# Patient Record
Sex: Female | Born: 1980
Health system: Southern US, Community
[De-identification: ages and names within clinical notes are randomized; demographics above are authoritative.]

## PROBLEM LIST (undated history)

## (undated) ENCOUNTER — Ambulatory Visit: Payer: BC Managed Care – PPO

## (undated) DIAGNOSIS — K219 Gastro-esophageal reflux disease without esophagitis: Secondary | ICD-10-CM

## (undated) DIAGNOSIS — R000 Tachycardia, unspecified: Secondary | ICD-10-CM

## (undated) HISTORY — PX: COLONOSCOPY: SHX174

## (undated) HISTORY — PX: WISDOM TOOTH EXTRACTION: SHX21

---

## 1998-10-28 ENCOUNTER — Other Ambulatory Visit: Admission: RE | Admit: 1998-10-28 | Discharge: 1998-10-28 | Payer: Self-pay | Admitting: Gynecology

## 1999-10-06 ENCOUNTER — Other Ambulatory Visit: Admission: RE | Admit: 1999-10-06 | Discharge: 1999-10-06 | Payer: Self-pay | Admitting: Gynecology

## 2000-10-25 ENCOUNTER — Other Ambulatory Visit: Admission: RE | Admit: 2000-10-25 | Discharge: 2000-10-25 | Payer: Self-pay | Admitting: Gynecology

## 2001-10-24 ENCOUNTER — Other Ambulatory Visit: Admission: RE | Admit: 2001-10-24 | Discharge: 2001-10-24 | Payer: Self-pay | Admitting: Gynecology

## 2002-10-27 ENCOUNTER — Other Ambulatory Visit: Admission: RE | Admit: 2002-10-27 | Discharge: 2002-10-27 | Payer: Self-pay | Admitting: Gynecology

## 2003-11-19 ENCOUNTER — Other Ambulatory Visit: Admission: RE | Admit: 2003-11-19 | Discharge: 2003-11-19 | Payer: Self-pay | Admitting: Gynecology

## 2004-06-23 ENCOUNTER — Encounter: Admission: RE | Admit: 2004-06-23 | Discharge: 2004-06-23 | Payer: Self-pay | Admitting: Family Medicine

## 2005-12-03 ENCOUNTER — Other Ambulatory Visit: Admission: RE | Admit: 2005-12-03 | Discharge: 2005-12-03 | Payer: Self-pay | Admitting: Gynecology

## 2006-12-13 ENCOUNTER — Other Ambulatory Visit: Admission: RE | Admit: 2006-12-13 | Discharge: 2006-12-13 | Payer: Self-pay | Admitting: Gynecology

## 2014-12-14 ENCOUNTER — Telehealth: Payer: Self-pay | Admitting: Internal Medicine

## 2014-12-14 NOTE — Telephone Encounter (Signed)
Received [facility] records and placed on Dr. Celesta Aver desk for review.

## 2014-12-25 NOTE — Telephone Encounter (Signed)
Both, Dr. Carlean Purl and Dr. Henrene Pastor declined to accept as a new patient.

## 2018-02-21 ENCOUNTER — Other Ambulatory Visit: Payer: Self-pay | Admitting: Obstetrics and Gynecology

## 2018-02-21 DIAGNOSIS — R928 Other abnormal and inconclusive findings on diagnostic imaging of breast: Secondary | ICD-10-CM

## 2018-02-25 ENCOUNTER — Ambulatory Visit
Admission: RE | Admit: 2018-02-25 | Discharge: 2018-02-25 | Disposition: A | Discharge status: Home / Self Care | Payer: BLUE CROSS/BLUE SHIELD | Source: Ambulatory Visit | Attending: Obstetrics and Gynecology | Admitting: Obstetrics and Gynecology

## 2018-02-25 ENCOUNTER — Ambulatory Visit: Payer: Self-pay

## 2018-02-25 ENCOUNTER — Other Ambulatory Visit: Payer: Self-pay | Admitting: Obstetrics and Gynecology

## 2018-02-25 DIAGNOSIS — N6310 Unspecified lump in the right breast, unspecified quadrant: Secondary | ICD-10-CM

## 2018-02-25 DIAGNOSIS — R928 Other abnormal and inconclusive findings on diagnostic imaging of breast: Secondary | ICD-10-CM

## 2018-08-31 ENCOUNTER — Other Ambulatory Visit: Payer: BLUE CROSS/BLUE SHIELD

## 2018-09-06 ENCOUNTER — Ambulatory Visit
Admission: RE | Admit: 2018-09-06 | Discharge: 2018-09-06 | Disposition: A | Discharge status: Home / Self Care | Payer: BC Managed Care – PPO | Source: Ambulatory Visit | Attending: Obstetrics and Gynecology | Admitting: Obstetrics and Gynecology

## 2018-09-06 ENCOUNTER — Other Ambulatory Visit: Payer: Self-pay

## 2018-09-06 ENCOUNTER — Other Ambulatory Visit: Payer: Self-pay | Admitting: Obstetrics and Gynecology

## 2018-09-06 DIAGNOSIS — N6310 Unspecified lump in the right breast, unspecified quadrant: Secondary | ICD-10-CM

## 2019-02-27 ENCOUNTER — Other Ambulatory Visit: Payer: BC Managed Care – PPO

## 2019-02-28 ENCOUNTER — Other Ambulatory Visit: Payer: Self-pay

## 2019-02-28 ENCOUNTER — Ambulatory Visit
Admission: RE | Admit: 2019-02-28 | Discharge: 2019-02-28 | Disposition: A | Discharge status: Home / Self Care | Payer: BC Managed Care – PPO | Source: Ambulatory Visit | Attending: Obstetrics and Gynecology | Admitting: Obstetrics and Gynecology

## 2019-02-28 DIAGNOSIS — N6310 Unspecified lump in the right breast, unspecified quadrant: Secondary | ICD-10-CM

## 2019-06-03 ENCOUNTER — Ambulatory Visit: Payer: BC Managed Care – PPO | Attending: Internal Medicine

## 2019-06-03 DIAGNOSIS — Z23 Encounter for immunization: Secondary | ICD-10-CM | POA: Insufficient documentation

## 2019-06-03 NOTE — Progress Notes (Signed)
   Covid-19 Vaccination Clinic  Name:  Kendra Perez    MRN: [id number] DOB: [birth year]  06/03/2019  Ms. Que was observed post Covid-19 immunization for 15 minutes without incidence. She was provided with Vaccine Information Sheet and instruction to access the V-Safe system.   [name] was instructed to call 911 with any severe reactions post vaccine: Marland Kitchen Difficulty breathing  . Swelling of your face and throat  . A fast heartbeat  . A bad rash all over your body  . Dizziness and weakness    Immunizations Administered    Name Date Dose VIS Date Route   Pfizer COVID-19 Vaccine 06/03/2019  9:41 AM 0.3 mL 03/17/2019 Intramuscular   Manufacturer: [place]   Lot: [id number]   [place]: [id number]

## 2019-06-04 ENCOUNTER — Ambulatory Visit: Payer: BC Managed Care – PPO

## 2019-06-24 ENCOUNTER — Ambulatory Visit: Payer: BC Managed Care – PPO | Attending: Internal Medicine

## 2019-06-24 DIAGNOSIS — Z23 Encounter for immunization: Secondary | ICD-10-CM

## 2019-06-24 NOTE — Progress Notes (Signed)
   Covid-19 Vaccination Clinic  Name:  Sharletta Bolan    MRN: [id number] DOB: [birth year]  06/24/2019  Ms. Rarick was observed post Covid-19 immunization for 15 minutes without incident. She was provided with Vaccine Information Sheet and instruction to access the V-Safe system.   Ms. Feehan was instructed to call 911 with any severe reactions post vaccine: Marland Kitchen Difficulty breathing  . Swelling of face and throat  . A fast heartbeat  . A bad rash all over body  . Dizziness and weakness   Immunizations Administered    Name Date Dose VIS Date Route   Pfizer COVID-19 Vaccine 06/24/2019  1:07 PM 0.3 mL 03/17/2019 Intramuscular   Manufacturer: [place]   Lot: [id number]   [place]: [id number]

## 2019-06-28 ENCOUNTER — Ambulatory Visit: Payer: BC Managed Care – PPO

## 2020-01-12 ENCOUNTER — Other Ambulatory Visit: Payer: Self-pay | Admitting: Obstetrics and Gynecology

## 2020-01-12 DIAGNOSIS — N6310 Unspecified lump in the right breast, unspecified quadrant: Secondary | ICD-10-CM

## 2020-01-12 DIAGNOSIS — Z1231 Encounter for screening mammogram for malignant neoplasm of breast: Secondary | ICD-10-CM

## 2020-03-04 ENCOUNTER — Ambulatory Visit: Payer: BC Managed Care – PPO

## 2020-03-07 ENCOUNTER — Ambulatory Visit
Admission: RE | Admit: 2020-03-07 | Discharge: 2020-03-07 | Disposition: A | Discharge status: Home / Self Care | Payer: BC Managed Care – PPO | Source: Ambulatory Visit | Attending: Obstetrics and Gynecology | Admitting: Obstetrics and Gynecology

## 2020-03-07 ENCOUNTER — Other Ambulatory Visit: Payer: Self-pay

## 2020-03-07 DIAGNOSIS — N6310 Unspecified lump in the right breast, unspecified quadrant: Secondary | ICD-10-CM

## 2020-07-17 NOTE — H&P (Signed)
Kendra Perez is an 40 y.o. G51 female here for scheduled abdominal myomectomy. Pt was diagnosed with a fibroid after had bouts of pelivc pain with radiation into her back. [place] confirmed pelvic exam findings as follows:  05/14/20 [place]: 12.5x11.7x7cm , multiple fibroids ( 8cm, 4cm and 3.7cm) : largest is left lateral, subserosal and pedunculated.   Pain is not persistent. Has pelvic pressure.  Menses regular. Hx atrophic vaginitis. Not certain she desires future fertility  Pertinent Gynecological History: Menses: regular  Bleeding: n/a Contraception: none DES exposure: denies Blood transfusions: none Sexually transmitted diseases: no past history Previous GYN Procedures: none  Last mammogram: normal Date: 03/2020 Last pap: normal Date: 03/2020 OB History: G0, P0   Menstrual History: Menarche age: n/a No LMP recorded.    No past medical history on file.  No past surgical history on file.  No family history on file.  Social History:  has no history on file for tobacco use, alcohol use, and drug use.  Allergies: Not on File  No medications prior to admission.    Review of Systems  Constitutional: Negative for activity change and fatigue.  Eyes: Negative for visual disturbance.  Respiratory: Negative for chest tightness and shortness of breath.   Cardiovascular: Negative for chest pain, palpitations and leg swelling.  Gastrointestinal: Negative for abdominal pain.  Genitourinary: Positive for pelvic pain.  Musculoskeletal: Negative for back pain and myalgias.  Neurological: Negative for headaches.  Psychiatric/Behavioral: Negative for sleep disturbance. The patient is nervous/anxious.     There were no vitals taken for this visit. Physical Exam Vitals and nursing note reviewed. Exam conducted with a chaperone present.  Constitutional:      Appearance: Normal appearance. She is normal weight.  Cardiovascular:     Rate and Rhythm: Normal rate.     Pulses: Normal  pulses.  Pulmonary:     Effort: Pulmonary effort is normal.  Abdominal:     Palpations: Abdomen is soft.  Genitourinary:    General: Normal vulva.  Musculoskeletal:        General: Normal range of motion.  Skin:    General: Skin is warm and dry.     Capillary Refill: Capillary refill takes 2 to 3 seconds.  Neurological:     Mental Status: She is alert and oriented to person, place, and time.  Psychiatric:        Mood and Affect: Mood normal.        Behavior: Behavior normal.        Thought Content: Thought content normal.        Judgment: Judgment normal.     No results found for this or any previous visit (from the past 24 hour(s)).  No results found.  Assessment/Plan: 40yo G0 female here for scheduled abdominal myomectomy  - Admit - Covid screen - TXA IV preop and misoprostol pv for bleeding mgmt - IV tylenol preop for better pain mgmt - Review procedure and verify consent  - To OR when ready  Venetia Night Devonte Migues 07/17/2020, 11:10 PM

## 2020-07-26 ENCOUNTER — Other Ambulatory Visit (HOSPITAL_COMMUNITY)
Admission: RE | Admit: 2020-07-26 | Discharge: 2020-07-26 | Disposition: A | Discharge status: Home / Self Care | Payer: BC Managed Care – PPO | Source: Ambulatory Visit | Attending: Obstetrics and Gynecology | Admitting: Obstetrics and Gynecology

## 2020-07-26 DIAGNOSIS — Z01812 Encounter for preprocedural laboratory examination: Secondary | ICD-10-CM | POA: Insufficient documentation

## 2020-07-26 DIAGNOSIS — Z20822 Contact with and (suspected) exposure to covid-19: Secondary | ICD-10-CM | POA: Insufficient documentation

## 2020-07-26 LAB — SARS CORONAVIRUS 2 (TAT 6-24 HRS): SARS Coronavirus 2: NEGATIVE

## 2020-07-29 ENCOUNTER — Encounter (HOSPITAL_COMMUNITY): Payer: Self-pay | Admitting: Obstetrics and Gynecology

## 2020-07-29 NOTE — Progress Notes (Signed)
Spoke with pt for pre-op call. Pt states she has a hx of tachycardia, takes Metoprolol. Dr. Mayra Neer is her PCP and follows this.   Covid test done 07/26/20 and it's negative. Pt states she's been in quarantine since the test was done and understands that she stays in quarantine until she comes to the hospital tomorrow.

## 2020-07-30 ENCOUNTER — Encounter (HOSPITAL_COMMUNITY)
Admission: RE | Disposition: A | Discharge status: Home / Self Care | Payer: Self-pay | Source: Home / Self Care | Attending: Obstetrics and Gynecology

## 2020-07-30 ENCOUNTER — Other Ambulatory Visit: Payer: Self-pay

## 2020-07-30 ENCOUNTER — Inpatient Hospital Stay (HOSPITAL_COMMUNITY)
Admission: RE | Admit: 2020-07-30 | Discharge: 2020-07-31 | DRG: 743 | Disposition: A | Discharge status: Home / Self Care | Payer: BC Managed Care – PPO | Attending: Obstetrics and Gynecology | Admitting: Obstetrics and Gynecology

## 2020-07-30 ENCOUNTER — Inpatient Hospital Stay (HOSPITAL_COMMUNITY): Payer: BC Managed Care – PPO | Admitting: Anesthesiology

## 2020-07-30 DIAGNOSIS — K219 Gastro-esophageal reflux disease without esophagitis: Secondary | ICD-10-CM | POA: Diagnosis present

## 2020-07-30 DIAGNOSIS — K660 Peritoneal adhesions (postprocedural) (postinfection): Secondary | ICD-10-CM | POA: Diagnosis present

## 2020-07-30 DIAGNOSIS — D252 Subserosal leiomyoma of uterus: Secondary | ICD-10-CM | POA: Diagnosis present

## 2020-07-30 DIAGNOSIS — D259 Leiomyoma of uterus, unspecified: Secondary | ICD-10-CM | POA: Diagnosis present

## 2020-07-30 HISTORY — DX: Tachycardia, unspecified: R00.0

## 2020-07-30 HISTORY — PX: MYOMECTOMY: SHX85

## 2020-07-30 HISTORY — DX: Gastro-esophageal reflux disease without esophagitis: K21.9

## 2020-07-30 LAB — CBC
HCT: 45.5 % (ref 36.0–46.0)
Hemoglobin: 15.2 g/dL — ABNORMAL HIGH (ref 12.0–15.0)
MCH: 30.3 pg (ref 26.0–34.0)
MCHC: 33.4 g/dL (ref 30.0–36.0)
MCV: 90.8 fL (ref 80.0–100.0)
Platelets: 184 10*3/uL (ref 150–400)
RBC: 5.01 MIL/uL (ref 3.87–5.11)
RDW: 12.4 % (ref 11.5–15.5)
WBC: 9.8 10*3/uL (ref 4.0–10.5)
nRBC: 0 % (ref 0.0–0.2)

## 2020-07-30 LAB — ABO/RH: ABO/RH(D): O POS

## 2020-07-30 LAB — TYPE AND SCREEN
ABO/RH(D): O POS
Antibody Screen: NEGATIVE

## 2020-07-30 LAB — POCT PREGNANCY, URINE: Preg Test, Ur: NEGATIVE

## 2020-07-30 SURGERY — MYOMECTOMY, ABDOMINAL APPROACH
Anesthesia: General | Site: Abdomen

## 2020-07-30 MED ORDER — LACTATED RINGERS IV SOLN: INTRAVENOUS | Status: DC

## 2020-07-30 MED ORDER — POVIDONE-IODINE 10 % EX SWAB
2.0000 "application " | Freq: Once | CUTANEOUS | Status: AC
  Administered 2020-07-30: 2 via TOPICAL

## 2020-07-30 MED ORDER — FENTANYL CITRATE (PF) 250 MCG/5ML IJ SOLN
INTRAMUSCULAR | Status: AC
  Filled 2020-07-30: qty 5

## 2020-07-30 MED ORDER — BUPIVACAINE HCL (PF) 0.25 % IJ SOLN
INTRAMUSCULAR | Status: AC
  Filled 2020-07-30: qty 30

## 2020-07-30 MED ORDER — FENTANYL CITRATE (PF) 250 MCG/5ML IJ SOLN
INTRAMUSCULAR | Status: DC | PRN
  Administered 2020-07-30: 100 ug via INTRAVENOUS
  Administered 2020-07-30 (×3): 50 ug via INTRAVENOUS

## 2020-07-30 MED ORDER — ORAL CARE MOUTH RINSE: 15.0000 mL | Freq: Once | OROMUCOSAL | Status: DC

## 2020-07-30 MED ORDER — MIDAZOLAM HCL 5 MG/5ML IJ SOLN
INTRAMUSCULAR | Status: DC | PRN
  Administered 2020-07-30 (×2): 1 mg via INTRAVENOUS

## 2020-07-30 MED ORDER — CHLORHEXIDINE GLUCONATE 0.12 % MT SOLN: 15.0000 mL | Freq: Once | OROMUCOSAL | Status: AC

## 2020-07-30 MED ORDER — SODIUM CHLORIDE (PF) 0.9 % IJ SOLN
INTRAMUSCULAR | Status: AC
  Filled 2020-07-30: qty 250

## 2020-07-30 MED ORDER — TRANEXAMIC ACID-NACL 1000-0.7 MG/100ML-% IV SOLN: 1000.0000 mg | INTRAVENOUS | Status: DC

## 2020-07-30 MED ORDER — CHLORHEXIDINE GLUCONATE 0.12 % MT SOLN
OROMUCOSAL | Status: AC
  Administered 2020-07-30: 15 mL via OROMUCOSAL
  Filled 2020-07-30: qty 15

## 2020-07-30 MED ORDER — ONDANSETRON HCL 4 MG PO TABS: 4.0000 mg | ORAL_TABLET | Freq: Four times a day (QID) | ORAL | Status: DC | PRN

## 2020-07-30 MED ORDER — LIDOCAINE 2% (20 MG/ML) 5 ML SYRINGE
INTRAMUSCULAR | Status: DC | PRN
  Administered 2020-07-30: 60 mg via INTRAVENOUS

## 2020-07-30 MED ORDER — LACTATED RINGERS IV SOLN: INTRAVENOUS | Status: DC | PRN

## 2020-07-30 MED ORDER — SODIUM CHLORIDE (PF) 0.9 % IJ SOLN
INTRAMUSCULAR | Status: DC | PRN
  Administered 2020-07-30: 200 mL

## 2020-07-30 MED ORDER — OXYCODONE HCL 5 MG PO TABS: 5.0000 mg | ORAL_TABLET | Freq: Once | ORAL | Status: DC | PRN

## 2020-07-30 MED ORDER — 0.9 % SODIUM CHLORIDE (POUR BTL) OPTIME
TOPICAL | Status: DC | PRN
  Administered 2020-07-30: 1000 mL

## 2020-07-30 MED ORDER — PROPOFOL 10 MG/ML IV BOLUS
INTRAVENOUS | Status: DC | PRN
  Administered 2020-07-30: 200 mg via INTRAVENOUS

## 2020-07-30 MED ORDER — SENNA 8.6 MG PO TABS
1.0000 | ORAL_TABLET | Freq: Two times a day (BID) | ORAL | Status: DC
  Administered 2020-07-30 – 2020-07-31 (×3): 8.6 mg via ORAL
  Filled 2020-07-30 (×3): qty 1

## 2020-07-30 MED ORDER — PROMETHAZINE HCL 25 MG/ML IJ SOLN: 6.2500 mg | INTRAMUSCULAR | Status: DC | PRN

## 2020-07-30 MED ORDER — SENNOSIDES-DOCUSATE SODIUM 8.6-50 MG PO TABS: 1.0000 | ORAL_TABLET | Freq: Every evening | ORAL | Status: DC | PRN

## 2020-07-30 MED ORDER — GENTAMICIN SULFATE 40 MG/ML IJ SOLN
INTRAVENOUS | Status: DC | PRN
  Administered 2020-07-30: 100.64999999999999 mg via INTRAVENOUS

## 2020-07-30 MED ORDER — ORAL CARE MOUTH RINSE
15.0000 mL | Freq: Once | OROMUCOSAL | Status: AC
Start: 2020-07-30 — End: 2020-07-30

## 2020-07-30 MED ORDER — PHENYLEPHRINE HCL-NACL 10-0.9 MG/250ML-% IV SOLN
INTRAVENOUS | Status: DC | PRN
  Administered 2020-07-30: 25 ug/min via INTRAVENOUS

## 2020-07-30 MED ORDER — IBUPROFEN 600 MG PO TABS
600.0000 mg | ORAL_TABLET | Freq: Four times a day (QID) | ORAL | Status: DC
  Administered 2020-07-30 – 2020-07-31 (×5): 600 mg via ORAL
  Filled 2020-07-30 (×5): qty 1

## 2020-07-30 MED ORDER — PROPOFOL 10 MG/ML IV BOLUS
INTRAVENOUS | Status: AC
  Filled 2020-07-30: qty 40

## 2020-07-30 MED ORDER — ALBUMIN HUMAN 5 % IV SOLN: INTRAVENOUS | Status: DC | PRN

## 2020-07-30 MED ORDER — METHYLENE BLUE 0.5 % INJ SOLN
INTRAVENOUS | Status: AC
  Filled 2020-07-30: qty 10

## 2020-07-30 MED ORDER — ONDANSETRON HCL 4 MG/2ML IJ SOLN: 4.0000 mg | Freq: Four times a day (QID) | INTRAMUSCULAR | Status: DC | PRN

## 2020-07-30 MED ORDER — FENTANYL CITRATE (PF) 100 MCG/2ML IJ SOLN
INTRAMUSCULAR | Status: AC
  Filled 2020-07-30: qty 2

## 2020-07-30 MED ORDER — OXYCODONE HCL 5 MG/5ML PO SOLN: 5.0000 mg | Freq: Once | ORAL | Status: DC | PRN

## 2020-07-30 MED ORDER — ROCURONIUM BROMIDE 10 MG/ML (PF) SYRINGE
PREFILLED_SYRINGE | INTRAVENOUS | Status: DC | PRN
  Administered 2020-07-30: 20 mg via INTRAVENOUS
  Administered 2020-07-30: 10 mg via INTRAVENOUS
  Administered 2020-07-30: 60 mg via INTRAVENOUS

## 2020-07-30 MED ORDER — MIDAZOLAM HCL 2 MG/2ML IJ SOLN
INTRAMUSCULAR | Status: AC
  Filled 2020-07-30: qty 2

## 2020-07-30 MED ORDER — ONDANSETRON HCL 4 MG/2ML IJ SOLN
INTRAMUSCULAR | Status: DC | PRN
  Administered 2020-07-30: 4 mg via INTRAVENOUS

## 2020-07-30 MED ORDER — VASOPRESSIN 20 UNIT/ML IV SOLN
INTRAVENOUS | Status: DC | PRN
  Administered 2020-07-30: 20 mL via INTRAMUSCULAR

## 2020-07-30 MED ORDER — CLINDAMYCIN PHOSPHATE 900 MG/50ML IV SOLN
INTRAVENOUS | Status: DC | PRN
  Administered 2020-07-30: 900 mg via INTRAVENOUS

## 2020-07-30 MED ORDER — KETOROLAC TROMETHAMINE 30 MG/ML IJ SOLN
INTRAMUSCULAR | Status: AC
  Filled 2020-07-30: qty 1

## 2020-07-30 MED ORDER — KETOROLAC TROMETHAMINE 30 MG/ML IJ SOLN
30.0000 mg | Freq: Once | INTRAMUSCULAR | Status: AC
  Administered 2020-07-30: 30 mg via INTRAVENOUS

## 2020-07-30 MED ORDER — OXYCODONE HCL 5 MG PO TABS
5.0000 mg | ORAL_TABLET | ORAL | Status: DC | PRN
  Administered 2020-07-30 (×2): 5 mg via ORAL
  Filled 2020-07-30 (×2): qty 1

## 2020-07-30 MED ORDER — GENTAMICIN SULFATE 40 MG/ML IJ SOLN
1.5000 mg/kg | INTRAVENOUS | Status: DC
  Filled 2020-07-30: qty 2.25

## 2020-07-30 MED ORDER — ACETAMINOPHEN 500 MG PO TABS
1000.0000 mg | ORAL_TABLET | ORAL | Status: AC
  Administered 2020-07-30: 1000 mg via ORAL
  Filled 2020-07-30: qty 2

## 2020-07-30 MED ORDER — SIMETHICONE 80 MG PO CHEW: 80.0000 mg | CHEWABLE_TABLET | Freq: Four times a day (QID) | ORAL | Status: DC | PRN

## 2020-07-30 MED ORDER — ACETAMINOPHEN 500 MG PO TABS
1000.0000 mg | ORAL_TABLET | Freq: Four times a day (QID) | ORAL | Status: DC
  Administered 2020-07-30 – 2020-07-31 (×4): 1000 mg via ORAL
  Filled 2020-07-30 (×5): qty 2

## 2020-07-30 MED ORDER — METHYLENE BLUE 0.5 % INJ SOLN
INTRAVENOUS | Status: DC | PRN
  Administered 2020-07-30: 10 mL

## 2020-07-30 MED ORDER — BUPIVACAINE HCL (PF) 0.25 % IJ SOLN
INTRAMUSCULAR | Status: DC | PRN
  Administered 2020-07-30: 24 mL

## 2020-07-30 MED ORDER — SUGAMMADEX SODIUM 200 MG/2ML IV SOLN
INTRAVENOUS | Status: DC | PRN
  Administered 2020-07-30: 140 mg via INTRAVENOUS

## 2020-07-30 MED ORDER — VASOPRESSIN 20 UNIT/ML IV SOLN
INTRAVENOUS | Status: AC
  Filled 2020-07-30: qty 1

## 2020-07-30 MED ORDER — CHLORHEXIDINE GLUCONATE 0.12 % MT SOLN: 15.0000 mL | Freq: Once | OROMUCOSAL | Status: DC

## 2020-07-30 MED ORDER — METOPROLOL SUCCINATE ER 25 MG PO TB24
25.0000 mg | ORAL_TABLET | Freq: Two times a day (BID) | ORAL | Status: DC
  Administered 2020-07-30 – 2020-07-31 (×2): 25 mg via ORAL
  Filled 2020-07-30 (×2): qty 1

## 2020-07-30 MED ORDER — FENTANYL CITRATE (PF) 100 MCG/2ML IJ SOLN
25.0000 ug | INTRAMUSCULAR | Status: DC | PRN
  Administered 2020-07-30 (×2): 25 ug via INTRAVENOUS

## 2020-07-30 MED ORDER — CLINDAMYCIN PHOSPHATE 900 MG/50ML IV SOLN
900.0000 mg | INTRAVENOUS | Status: DC
  Filled 2020-07-30: qty 50

## 2020-07-30 MED ORDER — SCOPOLAMINE 1 MG/3DAYS TD PT72
MEDICATED_PATCH | TRANSDERMAL | Status: DC | PRN
  Administered 2020-07-30: 1 via TRANSDERMAL

## 2020-07-30 MED ORDER — DEXAMETHASONE SODIUM PHOSPHATE 10 MG/ML IJ SOLN
INTRAMUSCULAR | Status: DC | PRN
  Administered 2020-07-30: 5 mg via INTRAVENOUS

## 2020-07-30 MED ORDER — BUPIVACAINE-EPINEPHRINE (PF) 0.25% -1:200000 IJ SOLN
INTRAMUSCULAR | Status: DC | PRN
  Administered 2020-07-30 (×2): 30 mL

## 2020-07-30 MED ORDER — TRANEXAMIC ACID-NACL 1000-0.7 MG/100ML-% IV SOLN
1000.0000 mg | INTRAVENOUS | Status: AC
  Administered 2020-07-30: 1000 mg via INTRAVENOUS
  Filled 2020-07-30: qty 100

## 2020-07-30 MED ORDER — PHENYLEPHRINE HCL (PRESSORS) 10 MG/ML IV SOLN
INTRAVENOUS | Status: DC | PRN
  Administered 2020-07-30: 120 ug via INTRAVENOUS

## 2020-07-30 SURGICAL SUPPLY — 50 items
APL SKNCLS STERI-STRIP NONHPOA (GAUZE/BANDAGES/DRESSINGS) ×1
BARRIER ADHS 3X4 INTERCEED (GAUZE/BANDAGES/DRESSINGS) ×1 IMPLANT
BENZOIN TINCTURE PRP APPL 2/3 (GAUZE/BANDAGES/DRESSINGS) ×2 IMPLANT
BRR ADH 4X3 ABS CNTRL BYND (GAUZE/BANDAGES/DRESSINGS) ×1
BRR ADH 6X5 SEPRAFILM 1 SHT (MISCELLANEOUS)
CANISTER SUCT 3000ML PPV (MISCELLANEOUS) ×2 IMPLANT
DECANTER SPIKE VIAL GLASS SM (MISCELLANEOUS) ×2 IMPLANT
DRAPE CESAREAN BIRTH W POUCH (DRAPES) IMPLANT
DRAPE WARM FLUID 44X44 (DRAPES) IMPLANT
DRSG OPSITE POSTOP 4X10 (GAUZE/BANDAGES/DRESSINGS) ×2 IMPLANT
DURAPREP 26ML APPLICATOR (WOUND CARE) ×2 IMPLANT
GAUZE 4X4 16PLY RFD (DISPOSABLE) IMPLANT
GLOVE BIO SURGEON STRL SZ 6.5 (GLOVE) ×3 IMPLANT
GLOVE SURG UNDER POLY LF SZ7 (GLOVE) ×4 IMPLANT
GOWN STRL REUS W/ TWL LRG LVL3 (GOWN DISPOSABLE) ×3 IMPLANT
GOWN STRL REUS W/TWL LRG LVL3 (GOWN DISPOSABLE) ×6
KIT TURNOVER KIT B (KITS) ×2 IMPLANT
MANIPULATOR UTERINE 7CM CLEARV (MISCELLANEOUS) ×1 IMPLANT
NDL SPNL 22GX3.5 QUINCKE BK (NEEDLE) IMPLANT
NEEDLE HYPO 22GX1.5 SAFETY (NEEDLE) ×1 IMPLANT
NEEDLE SPNL 22GX3.5 QUINCKE BK (NEEDLE) ×2 IMPLANT
NS IRRIG 1000ML POUR BTL (IV SOLUTION) ×2 IMPLANT
PACK ABDOMINAL GYN (CUSTOM PROCEDURE TRAY) ×2 IMPLANT
PAD ARMBOARD 7.5X6 YLW CONV (MISCELLANEOUS) ×2 IMPLANT
PAD OB MATERNITY 4.3X12.25 (PERSONAL CARE ITEMS) ×2 IMPLANT
PENCIL SMOKE EVACUATOR (MISCELLANEOUS) ×2 IMPLANT
RTRCTR C-SECT PINK 25CM LRG (MISCELLANEOUS) ×1 IMPLANT
RTRCTR WOUND ALEXIS 18CM SML (INSTRUMENTS)
SAVER CELL AAL HAEMONETICS (INSTRUMENTS) IMPLANT
SEPRAFILM MEMBRANE 5X6 (MISCELLANEOUS) IMPLANT
SPONGE LAP 18X18 RF (DISPOSABLE) ×2 IMPLANT
STRIP CLOSURE SKIN 1/2X4 (GAUZE/BANDAGES/DRESSINGS) ×2 IMPLANT
SUT MON AB 2-0 CT1 36 (SUTURE) ×4 IMPLANT
SUT MON AB 3-0 SH 27 (SUTURE) ×4
SUT MON AB 3-0 SH27 (SUTURE) ×2 IMPLANT
SUT PDS AB 0 CTX 60 (SUTURE) IMPLANT
SUT PLAIN 3 0 CT 1 27 (SUTURE) IMPLANT
SUT VIC AB 0 CT1 18XCR BRD8 (SUTURE) ×1 IMPLANT
SUT VIC AB 0 CT1 36 (SUTURE) ×2 IMPLANT
SUT VIC AB 0 CT1 8-18 (SUTURE) ×4
SUT VIC AB 2-0 SH 27 (SUTURE) ×2
SUT VIC AB 2-0 SH 27X BRD (SUTURE) IMPLANT
SUT VIC AB 2-0 UR6 27 (SUTURE) IMPLANT
SUT VIC AB 4-0 KS 27 (SUTURE) ×2 IMPLANT
SUT VIC AB 4-0 SH 27 (SUTURE)
SUT VIC AB 4-0 SH 27XBRD (SUTURE) IMPLANT
SYR 30ML LL (SYRINGE) ×2 IMPLANT
SYR CONTROL 10ML LL (SYRINGE) ×4 IMPLANT
TOWEL GREEN STERILE FF (TOWEL DISPOSABLE) ×4 IMPLANT
TRAY FOLEY W/BAG SLVR 14FR (SET/KITS/TRAYS/PACK) ×2 IMPLANT

## 2020-07-30 NOTE — Progress Notes (Signed)
Patient ID: Kendra Perez, female   DOB: [birth year], 40 y.o.   MRN: [id number] Pt doing well post surgery. She has ambulated in hallways, tolerated a meal and fluids and is sitting up in a chair now. She reports abdominal soreness but pain is relatively well controlled with medication. She denies fever, chills, CP or SOB.  VSS: 110/66, 92 GEN - NAD ABD - dressing intact EXT - no homans   A/P: POD#0 s/p abdominal myomectomy with lysis of adhesions         Reviewed surgical findings and answered questions         Recommend HSG if decides to try for pregnancy         Routine post op visit         CBC in am

## 2020-07-30 NOTE — Discharge Instructions (Signed)
Call office with any concerns [phone] 

## 2020-07-30 NOTE — Anesthesia Procedure Notes (Signed)
Anesthesia Regional Block: TAP block   Pre-Anesthetic Checklist: ,, timeout performed, Correct Patient, Correct Site, Correct Laterality, Correct Procedure, Correct Position, site marked, Risks and benefits discussed,  Surgical consent,  Pre-op evaluation,  At surgeon's request and post-op pain management  Laterality: Right  Prep: chloraprep       Needles:  Injection technique: Single-shot  Needle Type: Echogenic Needle     Needle Length: 10cm  Needle Gauge: 21     Additional Needles:   Narrative:  Start time: 07/30/2020 7:32 AM End time: 07/30/2020 7:35 AM Injection made incrementally with aspirations every 5 mL.  Performed by: Personally  Anesthesiologist: Audry Pili, MD  Additional Notes: No pain on injection. No increased resistance to injection. Injection made in 5cc increments. Good needle visualization. Patient tolerated the procedure well.

## 2020-07-30 NOTE — Anesthesia Postprocedure Evaluation (Signed)
Anesthesia Post Note  Patient: Kendra Perez  Procedure(s) Performed: ABDOMINAL MYOMECTOMY (N/A Abdomen)     Patient location during evaluation: PACU Anesthesia Type: General Level of consciousness: awake and alert Pain management: pain level controlled Vital Signs Assessment: post-procedure vital signs reviewed and stable Respiratory status: spontaneous breathing, nonlabored ventilation and respiratory function stable Cardiovascular status: blood pressure returned to baseline and stable Postop Assessment: no apparent nausea or vomiting Anesthetic complications: no   No complications documented.  Last Vitals:  Vitals:   07/30/20 1151 07/30/20 1226  BP: (!) 92/51 98/63  Pulse: 76 83  Resp: 17 16  Temp: 37 C 36.5 C  SpO2: 99% 100%    Last Pain:  Vitals:   07/30/20 1226  TempSrc: Oral  PainSc:                  Audry Pili

## 2020-07-30 NOTE — Anesthesia Procedure Notes (Signed)
Anesthesia Regional Block: TAP block   Pre-Anesthetic Checklist: ,, timeout performed, Correct Patient, Correct Site, Correct Laterality, Correct Procedure, Correct Position, site marked, Risks and benefits discussed,  Surgical consent,  Pre-op evaluation,  At surgeon's request and post-op pain management  Laterality: Left  Prep: chloraprep       Needles:  Injection technique: Single-shot  Needle Type: Echogenic Needle     Needle Length: 10cm  Needle Gauge: 21     Additional Needles:   Narrative:  Start time: 07/30/2020 7:28 AM End time: 07/30/2020 7:32 AM Injection made incrementally with aspirations every 5 mL.  Performed by: Personally  Anesthesiologist: Audry Pili, MD  Additional Notes: No pain on injection. No increased resistance to injection. Injection made in 5cc increments. Good needle visualization. Patient tolerated the procedure well.

## 2020-07-30 NOTE — Op Note (Addendum)
Operative Note    Preoperative Diagnosis 1. Uterine leiomyomata - multiple   Postoperative Diagnosis:  1. Same  2. Intrabdominal adhesions   Procedure: Abdominal myomectomy with lysis of adhesions, chromopertubation    Surgeon: Mickle Mallory, DO  Assist: Charolette Child, MD  Anesthesia: General plus tap block   Fluids: LR 985ml; 1unit ( 215ml ) albumen  EBL: 540ml UOP: 160ml   Findings: multiple fibroids: large fundal subserosal fibroid (10+ cm sized); underlying smaller fundal fibroid ( 4cm) and anterior 4cm lower uterine segment fibroid. Multiple small susbserosal fibroids   Specimen: leiomyomata    Procedure Note  Pt was taken to operating room where general anesthesia was administered and found to be adequate. Pt was placed in dorsal supine position with her arms at her side.  Her  abdomen was then prepped and draped in sterile fashion and an appropriate timeout performed.A foley catheter was sterilely placed and a ClearVue uterine manipulator placed.  A pfannestial skin incision was made with the scalpel two cm above her pubic symphysis and carried down to the level of the fascia. Small areas of bleeding were coagulated.The fascia was incised in the midline and the incision extended laterally with mayo scissors first undermining the fascia. Next, the superior aspect of the fascia was elevated with kocher clamps and the underlying rectus muscles dissected away with sharp dissection due to scarring. In a similar fashion the lower aspect of the incision was also grasped, elevated and the fascia dissected from the rectus.  The peritoneum was then entered and the incision extended cephalad and caudad. The alexis retractor was then placed and bowels tucked away with wet lap sponges to ensure good visualization. Pt was placed in trendelenberg.  Gross survey of the pelvic organs noted an enlarged uterus with a large multilobular fundal fibroid. The uterus was exteriorized. The left fallopian  tube cornua  was noted to be barely under the fibroid mass.  10ccs of methylene blue was injected with no extravasation noted from the tubes.   A vasopressin mix was injected into the serosal/fibroid.and a lateral incision made.  Fibroid was right under the subserosa. It was excised bluntly and with cautery.  Once it was removed, a separate, smaller but also fundal fibroid was noted underneath. More vasopression was injected and it was also removed bluntly and with cautery.  An anterior fibroid, also subserosal was removed with several smaller ones noted underneath - also removed. The endometrium was slightly exposed here.  At this time, the myometrial openings were closed using interrupted stiches of o - vicryl pop offs. The excess serosa was excised and the remainder closed using monocryl suture in a baseball fashion. There were about 4 small serosal fibroids excised with cautery.  Copious irrigation of the pelvis was performed with great hemostasis noted. A second attempt at injecting methlyene blue through the Clearvue noted no extravasation through incision sites or the fimbria of the tubes. Interceed was placed over the repaired incisions.  At this time the patient was flattened, alexis retractor and sponges removed. The peritoneum was closed with 2-0 monocryl. The fascia was closed next with 0-vicryl suture. The subcutaneous layer with 3-0 monocryl and the incision with 4-0 vicryl on a keith needle.  Pt tolerated the procedure well with no complications Counts correct per staff x 3

## 2020-07-30 NOTE — Interval H&P Note (Signed)
History and Physical Interval Note: Pt seen and procedure reviewed. No change since H/P done Consent confirmed  To OR when ready  07/30/2020 8:00 AM  Kendra Perez  has presented today for surgery, with the diagnosis of uterine leiomyoma.  The various methods of treatment have been discussed with the patient and family. After consideration of risks, benefits and other options for treatment, the patient has consented to  Procedure(s): ABDOMINAL MYOMECTOMY (N/A) as a surgical intervention.  The patient's history has been reviewed, patient examined, no change in status, stable for surgery.  I have reviewed the patient's chart and labs.  Questions were answered to the patient's satisfaction.     Isaiah Serge

## 2020-07-30 NOTE — Anesthesia Preprocedure Evaluation (Addendum)
Anesthesia Evaluation  Patient identified by MRN, date of birth, ID band Patient awake    Reviewed: Allergy & Precautions, NPO status , Patient's Chart, lab work & pertinent test results, reviewed documented beta blocker date and time   History of Anesthesia Complications Negative for: history of anesthetic complications  Airway Mallampati: I  TM Distance: >3 FB Neck ROM: Full    Dental  (+) Dental Advisory Given, Teeth Intact   Pulmonary neg pulmonary ROS,    Pulmonary exam normal        Cardiovascular + dysrhythmias (tachycardia on beta blocker)  Rhythm:Regular Rate:Tachycardia     Neuro/Psych negative neurological ROS  negative psych ROS   GI/Hepatic Neg liver ROS, GERD  ,  Endo/Other  negative endocrine ROS  Renal/GU negative Renal ROS     Musculoskeletal negative musculoskeletal ROS (+)   Abdominal   Peds  Hematology negative hematology ROS (+)   Anesthesia Other Findings Covid test negative   Reproductive/Obstetrics                            Anesthesia Physical Anesthesia Plan  ASA: II  Anesthesia Plan: General   Post-op Pain Management:    Induction: Intravenous  PONV Risk Score and Plan: 4 or greater and Treatment may vary due to age or medical condition, Ondansetron, Scopolamine patch - Pre-op, Midazolam and Dexamethasone  Airway Management Planned: Oral ETT  Additional Equipment: None  Intra-op Plan:   Post-operative Plan: Extubation in OR  Informed Consent: I have reviewed the patients History and Physical, chart, labs and discussed the procedure including the risks, benefits and alternatives for the proposed anesthesia with the patient or authorized representative who has indicated his/her understanding and acceptance.     Dental advisory given  Plan Discussed with: CRNA and Anesthesiologist  Anesthesia Plan Comments:        Anesthesia Quick  Evaluation

## 2020-07-30 NOTE — Anesthesia Procedure Notes (Signed)
Procedure Name: Intubation Date/Time: 07/30/2020 8:16 AM Performed by: Glynda Jaeger, CRNA Pre-anesthesia Checklist: Patient identified, Patient being monitored, Timeout performed, Emergency Drugs available and Suction available Patient Re-evaluated:Patient Re-evaluated prior to induction Oxygen Delivery Method: Circle System Utilized Preoxygenation: Pre-oxygenation with 100% oxygen Induction Type: IV induction Ventilation: Mask ventilation without difficulty Laryngoscope Size: Mac and 3 Grade View: Grade I Tube type: Oral Tube size: 7.5 mm Number of attempts: 1 Airway Equipment and Method: Stylet Placement Confirmation: ETT inserted through vocal cords under direct vision,  positive ETCO2 and breath sounds checked- equal and bilateral Secured at: 22 cm Tube secured with: Tape Dental Injury: Teeth and Oropharynx as per pre-operative assessment

## 2020-07-30 NOTE — Transfer of Care (Signed)
Immediate Anesthesia Transfer of Care Note  Patient: Kendra Perez  Procedure(s) Performed: ABDOMINAL MYOMECTOMY (N/A Abdomen)  Patient Location: PACU  Anesthesia Type:General  Level of Consciousness: awake, alert , patient cooperative and responds to stimulation  Airway & Oxygen Therapy: Patient Spontanous Breathing and Patient connected to face mask oxygen  Post-op Assessment: Report given to RN and Post -op Vital signs reviewed and stable  Post vital signs: Reviewed and stable  Last Vitals:  Vitals Value Taken Time  BP 108/67 07/30/20 1027  Temp    Pulse 93 07/30/20 1029  Resp 18 07/30/20 1029  SpO2 98 % 07/30/20 1029  Vitals shown include unvalidated device data.  Last Pain:  Vitals:   07/30/20 0630  TempSrc: Oral         Complications: No complications documented.

## 2020-07-31 ENCOUNTER — Encounter (HOSPITAL_COMMUNITY): Payer: Self-pay | Admitting: Obstetrics and Gynecology

## 2020-07-31 LAB — CBC
HCT: 31 % — ABNORMAL LOW (ref 36.0–46.0)
Hemoglobin: 10.3 g/dL — ABNORMAL LOW (ref 12.0–15.0)
MCH: 30.4 pg (ref 26.0–34.0)
MCHC: 33.2 g/dL (ref 30.0–36.0)
MCV: 91.4 fL (ref 80.0–100.0)
Platelets: 154 10*3/uL (ref 150–400)
RBC: 3.39 MIL/uL — ABNORMAL LOW (ref 3.87–5.11)
RDW: 12.6 % (ref 11.5–15.5)
WBC: 12.4 10*3/uL — ABNORMAL HIGH (ref 4.0–10.5)
nRBC: 0 % (ref 0.0–0.2)

## 2020-07-31 LAB — SURGICAL PATHOLOGY

## 2020-07-31 MED ORDER — OXYCODONE-ACETAMINOPHEN 5-325 MG PO TABS: 1.0000 | ORAL_TABLET | ORAL | 0 refills | Status: AC | PRN

## 2020-07-31 MED ORDER — IBUPROFEN 600 MG PO TABS: 600.0000 mg | ORAL_TABLET | Freq: Four times a day (QID) | ORAL | 1 refills | Status: AC | PRN

## 2020-07-31 MED ORDER — SIMETHICONE 80 MG PO CHEW: 80.0000 mg | CHEWABLE_TABLET | Freq: Four times a day (QID) | ORAL | 1 refills | Status: AC | PRN

## 2020-07-31 NOTE — Progress Notes (Signed)
Nurse called dr Terri Piedra for updates, voice mail left

## 2020-07-31 NOTE — Discharge Summary (Signed)
Physician Discharge Summary  Patient ID: Kendra Perez MRN: [id number] DOB/AGE: 1980-11-01 40 y.o.  Admit date: 07/30/2020 Discharge date: 07/31/2020  Admission Diagnoses: Uterine leimyomata  Discharge Diagnoses: S/P abdominal myomectomy with lysis of adhesions Active Problems:   Fibroid uterus   Discharged Condition: stable  Hospital Course: Pt underwent surgery with no complications. She recovered well on POD#1 . Deemed and felt stable for discharge to home  Consults: None  Significant Diagnostic Studies: labs: wnl  Treatments: IV hydration, antibiotics: gentamycin and clindamycin, analgesia: acetaminophen and ibuprofen and oxycodone and surgery: abdominal myomectomy  Discharge Exam: Blood pressure 112/60, pulse 89, temperature 98.7 F (37.1 C), temperature source Oral, resp. rate 18, height 5\' 5"  (1.651 m), weight 67.1 kg, last menstrual period 07/09/2020, SpO2 100 %. General appearance: alert, cooperative and no distress GI: soft, non-tender; bowel sounds normal; no masses,  no organomegaly Pulses: 2+ and symmetric  Disposition:   Discharge Instructions    Call MD for:  difficulty breathing, headache or visual disturbances   Complete by: As directed    Call MD for:  persistant dizziness or light-headedness   Complete by: As directed    Call MD for:  persistant nausea and vomiting   Complete by: As directed    Call MD for:  redness, tenderness, or signs of infection (pain, swelling, redness, odor or green/yellow discharge around incision site)   Complete by: As directed    Call MD for:  severe uncontrolled pain   Complete by: As directed    Call MD for:  temperature >100.4   Complete by: As directed    Diet - low sodium heart healthy   Complete by: As directed    Discharge instructions   Complete by: As directed    Call office with any concerns [phone]   Discharge wound care:   Complete by: As directed    Remove dressing a week after surgery    Driving Restrictions   Complete by: As directed    None while taking narcotic medications   Increase activity slowly   Complete by: As directed    Lifting restrictions   Complete by: As directed    <15lbs till incision check visit   Sexual Activity Restrictions   Complete by: As directed    None for 6 weeks     Allergies as of 07/31/2020      Reactions   Penicillins Other (See Comments)   Unknown childhood allergy   Sulfa Antibiotics    Unknown childhood allergy      Medication List    TAKE these medications   alum & mag hydroxide-simeth 200-200-20 MG/5ML suspension Commonly known as: MAALOX/MYLANTA Take 30 mLs by mouth every 6 (six) hours as needed for indigestion or heartburn.   Benefiber Powd Take 1 Dose by mouth daily. 1 dose = 2 teaspoons   calcium carbonate 500 MG chewable tablet Commonly known as: TUMS - dosed in mg elemental calcium Chew 1,000-1,500 mg by mouth daily as needed for indigestion or heartburn.   ibuprofen 600 MG tablet Commonly known as: ADVIL Take 1 tablet (600 mg total) by mouth every 6 (six) hours as needed for moderate pain or cramping. What changed:   medication strength  how much to take  reasons to take this   metoprolol succinate 25 MG 24 hr tablet Commonly known as: TOPROL-XL Take 25 mg by mouth 2 (two) times daily.   norgestimate-ethinyl estradiol 0.25-35 MG-MCG tablet Commonly known as: ORTHO-CYCLEN Take 1 tablet by  mouth daily.   oxyCODONE-acetaminophen 5-325 MG tablet Commonly known as: Percocet Take 1 tablet by mouth every 4 (four) hours as needed for up to 7 days for severe pain.   PROBIOTIC PO Take 1 capsule by mouth daily.   simethicone 80 MG chewable tablet Commonly known as: Gas-X Chew 1 tablet (80 mg total) by mouth every 6 (six) hours as needed for flatulence.            Discharge Care Instructions  (From admission, onward)         Start     Ordered   07/31/20 0000  Discharge wound care:        Comments: Remove dressing a week after surgery   07/31/20 0857          Follow-up Information    Carlynn Purl Worema, DO. Schedule an appointment as soon as possible for a visit in 2 week(s).   Specialty: Obstetrics and Gynecology Why: For post operative visit and incision check  Contact information: [place] [address] [phone]               Signed: Isaiah Serge 07/31/2020, 8:57 AM

## 2020-07-31 NOTE — Plan of Care (Signed)
  Problem: Education: Goal: Knowledge of General Education information will improve Description: Including pain rating scale, medication(s)/side effects and non-pharmacologic comfort measures 07/31/2020 1450 by Camillia Herter, RN Outcome: Progressing 07/31/2020 1257 by Camillia Herter, RN Outcome: Progressing   Problem: Health Behavior/Discharge Planning: Goal: Ability to manage health-related needs will improve 07/31/2020 1450 by Camillia Herter, RN Outcome: Progressing 07/31/2020 1257 by Camillia Herter, RN Outcome: Progressing   Problem: Clinical Measurements: Goal: Ability to maintain clinical measurements within normal limits will improve 07/31/2020 1450 by Camillia Herter, RN Outcome: Progressing 07/31/2020 1257 by Camillia Herter, RN Outcome: Progressing Goal: Will remain free from infection 07/31/2020 1450 by Camillia Herter, RN Outcome: Progressing 07/31/2020 1257 by Camillia Herter, RN Outcome: Progressing Goal: Diagnostic test results will improve 07/31/2020 1450 by Camillia Herter, RN Outcome: Progressing 07/31/2020 1257 by Camillia Herter, RN Outcome: Progressing Goal: Respiratory complications will improve 07/31/2020 1450 by Camillia Herter, RN Outcome: Progressing 07/31/2020 1257 by Julius Bowels A, RN Outcome: Progressing Goal: Cardiovascular complication will be avoided 07/31/2020 1450 by Camillia Herter, RN Outcome: Progressing 07/31/2020 1257 by Camillia Herter, RN Outcome: Progressing   Problem: Activity: Goal: Risk for activity intolerance will decrease 07/31/2020 1450 by Camillia Herter, RN Outcome: Progressing 07/31/2020 1257 by Julius Bowels A, RN Outcome: Progressing   Problem: Nutrition: Goal: Adequate nutrition will be maintained 07/31/2020 1450 by Camillia Herter, RN Outcome: Progressing 07/31/2020 1257 by Camillia Herter, RN Outcome: Progressing   Problem: Coping: Goal: Level of anxiety will decrease 07/31/2020 1450 by Camillia Herter, RN Outcome: Progressing 07/31/2020 1257 by Julius Bowels A, RN Outcome: Progressing   Problem: Elimination: Goal: Will not experience complications related to bowel motility 07/31/2020 1450 by Camillia Herter, RN Outcome: Progressing 07/31/2020 1257 by Camillia Herter, RN Outcome: Progressing Goal: Will not experience complications related to urinary retention 07/31/2020 1450 by Camillia Herter, RN Outcome: Progressing 07/31/2020 1257 by Camillia Herter, RN Outcome: Progressing   Problem: Pain Managment: Goal: General experience of comfort will improve 07/31/2020 1450 by Camillia Herter, RN Outcome: Progressing 07/31/2020 1257 by Julius Bowels A, RN Outcome: Progressing   Problem: Safety: Goal: Ability to remain free from injury will improve 07/31/2020 1450 by Camillia Herter, RN Outcome: Progressing 07/31/2020 1257 by Julius Bowels A, RN Outcome: Progressing   Problem: Skin Integrity: Goal: Risk for impaired skin integrity will decrease 07/31/2020 1450 by Camillia Herter, RN Outcome: Progressing 07/31/2020 1257 by Camillia Herter, RN Outcome: Progressing

## 2020-07-31 NOTE — Plan of Care (Signed)

## 2020-07-31 NOTE — Progress Notes (Signed)
1 Day Post-Op Procedure(s) (LRB): ABDOMINAL MYOMECTOMY (N/A)  Subjective: Patient reports incisional pain, tolerating PO and + flatus. Rates pain at 4-5/10 ; due for pain medication now. She denies lightheadedness, fever, chills, SOB or CP. She is sitting in chair again. Has not voided yet since foley removed this am. Ate full breakfast. Feels comfortable with discharge to home today- her mother will be home with her and able to assist her if needed.    Objective: I have reviewed patient's vital signs, intake and output, medications and labs.  General: alert, cooperative and no distress GI: soft, non-tender; bowel sounds normal; no masses,  no organomegaly and incision: dry, intact and old dried drainage present Extremities: extremities normal, atraumatic, no cyanosis or edema  Assessment: s/p Procedure(s): ABDOMINAL MYOMECTOMY (N/A): stable and progressing well  Plan: Will monitor till pt able to void. if does and still feels up to it, will discharge her later today. Otherwise, will stay till tomorrow. Will adjust pain medication regimen : pt to take tylenol withi oxycodone then stagger with ibuprofen   LOS: 1 day    Dameon Soltis W Asianae Minkler 07/31/2020, 8:49 AM

## 2020-07-31 NOTE — Progress Notes (Signed)
Mosetta Putt Cafaro to be D/C'd  per MD order. Discussed with the patient and all questions fully answered.  VSS, Skin clean, dry and intact without evidence of skin break down, no evidence of skin tears noted.  IV catheter discontinued intact. Site without signs and symptoms of complications. Dressing and pressure applied.  An After Visit Summary was printed and given to the patient. Patient received prescription.  D/c education completed with patient/family including follow up instructions, medication list, d/c activities limitations if indicated, with other d/c instructions as indicated by MD - patient able to verbalize understanding, all questions fully answered.   Patient instructed to return to ED, call 911, or call MD for any changes in condition.   Patient to be escorted via [place], and D/C home via private auto.

## 2020-07-31 NOTE — Plan of Care (Signed)
Problem: Education: Goal: Knowledge of General Education information will improve Description: Including pain rating scale, medication(s)/side effects and non-pharmacologic comfort measures 07/31/2020 1451 by Camillia Herter, RN Outcome: Adequate for Discharge 07/31/2020 1450 by Camillia Herter, RN Outcome: Progressing 07/31/2020 1257 by Camillia Herter, RN Outcome: Progressing   Problem: Health Behavior/Discharge Planning: Goal: Ability to manage health-related needs will improve 07/31/2020 1451 by Camillia Herter, RN Outcome: Adequate for Discharge 07/31/2020 1450 by Camillia Herter, RN Outcome: Progressing 07/31/2020 1257 by Camillia Herter, RN Outcome: Progressing   Problem: Clinical Measurements: Goal: Ability to maintain clinical measurements within normal limits will improve 07/31/2020 1451 by Camillia Herter, RN Outcome: Adequate for Discharge 07/31/2020 1450 by Camillia Herter, RN Outcome: Progressing 07/31/2020 1257 by Camillia Herter, RN Outcome: Progressing Goal: Will remain free from infection 07/31/2020 1451 by Camillia Herter, RN Outcome: Adequate for Discharge 07/31/2020 1450 by Camillia Herter, RN Outcome: Progressing 07/31/2020 1257 by Camillia Herter, RN Outcome: Progressing Goal: Diagnostic test results will improve 07/31/2020 1451 by Camillia Herter, RN Outcome: Adequate for Discharge 07/31/2020 1450 by Camillia Herter, RN Outcome: Progressing 07/31/2020 1257 by Camillia Herter, RN Outcome: Progressing Goal: Respiratory complications will improve 07/31/2020 1451 by Camillia Herter, RN Outcome: Adequate for Discharge 07/31/2020 1450 by Camillia Herter, RN Outcome: Progressing 07/31/2020 1257 by Julius Bowels A, RN Outcome: Progressing Goal: Cardiovascular complication will be avoided 07/31/2020 1451 by Camillia Herter, RN Outcome: Adequate for Discharge 07/31/2020 1450 by Camillia Herter, RN Outcome: Progressing 07/31/2020 1257 by Camillia Herter, RN Outcome: Progressing   Problem: Activity: Goal: Risk for activity intolerance will decrease 07/31/2020 1451 by Camillia Herter, RN Outcome: Adequate for Discharge 07/31/2020 1450 by Camillia Herter, RN Outcome: Progressing 07/31/2020 1257 by Camillia Herter, RN Outcome: Progressing   Problem: Nutrition: Goal: Adequate nutrition will be maintained 07/31/2020 1451 by Camillia Herter, RN Outcome: Adequate for Discharge 07/31/2020 1450 by Camillia Herter, RN Outcome: Progressing 07/31/2020 1257 by Camillia Herter, RN Outcome: Progressing   Problem: Coping: Goal: Level of anxiety will decrease 07/31/2020 1451 by Camillia Herter, RN Outcome: Adequate for Discharge 07/31/2020 1450 by Camillia Herter, RN Outcome: Progressing 07/31/2020 1257 by Camillia Herter, RN Outcome: Progressing   Problem: Elimination: Goal: Will not experience complications related to bowel motility 07/31/2020 1451 by Camillia Herter, RN Outcome: Adequate for Discharge 07/31/2020 1450 by Camillia Herter, RN Outcome: Progressing 07/31/2020 1257 by Camillia Herter, RN Outcome: Progressing Goal: Will not experience complications related to urinary retention 07/31/2020 1451 by Camillia Herter, RN Outcome: Adequate for Discharge 07/31/2020 1450 by Camillia Herter, RN Outcome: Progressing 07/31/2020 1257 by Camillia Herter, RN Outcome: Progressing   Problem: Pain Managment: Goal: General experience of comfort will improve 07/31/2020 1451 by Camillia Herter, RN Outcome: Adequate for Discharge 07/31/2020 1450 by Camillia Herter, RN Outcome: Progressing 07/31/2020 1257 by Julius Bowels A, RN Outcome: Progressing   Problem: Safety: Goal: Ability to remain free from injury will improve 07/31/2020 1451 by Camillia Herter, RN Outcome: Adequate for Discharge 07/31/2020 1450 by Camillia Herter, RN Outcome: Progressing 07/31/2020 1257 by Julius Bowels A, RN Outcome: Progressing   Problem:  Skin Integrity: Goal: Risk for impaired skin integrity will decrease 07/31/2020 1451 by Camillia Herter, RN Outcome: Adequate for Discharge 07/31/2020 1450 by Camillia Herter, RN Outcome: Progressing 07/31/2020 1257 by Camillia Herter, RN  Outcome: Progressing   

## 2020-12-18 ENCOUNTER — Other Ambulatory Visit: Payer: Self-pay | Admitting: Obstetrics and Gynecology

## 2020-12-18 DIAGNOSIS — Z1231 Encounter for screening mammogram for malignant neoplasm of breast: Secondary | ICD-10-CM

## 2021-03-11 ENCOUNTER — Ambulatory Visit
Admission: RE | Admit: 2021-03-11 | Discharge: 2021-03-11 | Disposition: A | Discharge status: Home / Self Care | Payer: BC Managed Care – PPO | Source: Ambulatory Visit | Attending: Obstetrics and Gynecology | Admitting: Obstetrics and Gynecology

## 2021-03-11 DIAGNOSIS — Z1231 Encounter for screening mammogram for malignant neoplasm of breast: Secondary | ICD-10-CM

## 2022-01-19 ENCOUNTER — Other Ambulatory Visit: Payer: Self-pay | Admitting: Obstetrics and Gynecology

## 2022-01-19 DIAGNOSIS — Z1231 Encounter for screening mammogram for malignant neoplasm of breast: Secondary | ICD-10-CM

## 2022-03-13 ENCOUNTER — Ambulatory Visit: Payer: BC Managed Care – PPO

## 2022-03-20 ENCOUNTER — Ambulatory Visit
Admission: RE | Admit: 2022-03-20 | Discharge: 2022-03-20 | Disposition: A | Discharge status: Home / Self Care | Payer: BC Managed Care – PPO | Source: Ambulatory Visit | Attending: Obstetrics and Gynecology | Admitting: Obstetrics and Gynecology

## 2022-03-20 DIAGNOSIS — Z1231 Encounter for screening mammogram for malignant neoplasm of breast: Secondary | ICD-10-CM

## 2022-05-28 IMAGING — MG MM DIGITAL SCREENING BILAT W/ TOMO AND CAD
8 series · 8 of 24 positions shown · non-contrast
Comparison: Previous exam(s).

CLINICAL DATA: Screening.

EXAM:
DIGITAL SCREENING BILATERAL MAMMOGRAM WITH TOMOSYNTHESIS AND CAD
TECHNIQUE: Bilateral screening digital craniocaudal and mediolateral oblique
mammograms were obtained. Bilateral screening digital breast
tomosynthesis was performed. The images were evaluated with
computer-aided detection.

[R CC synth-2D]
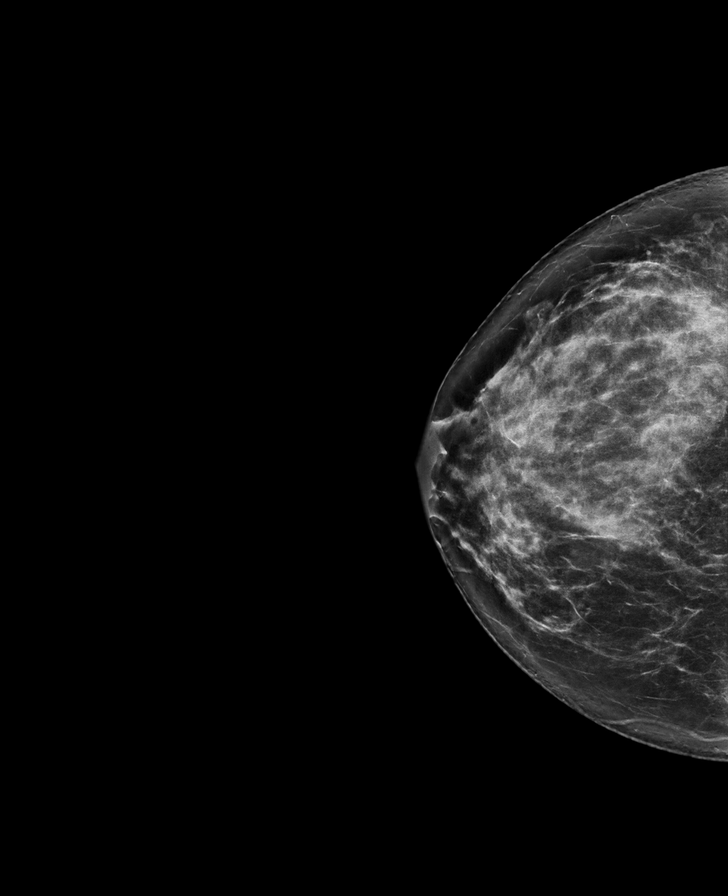

[R MLO synth-2D]
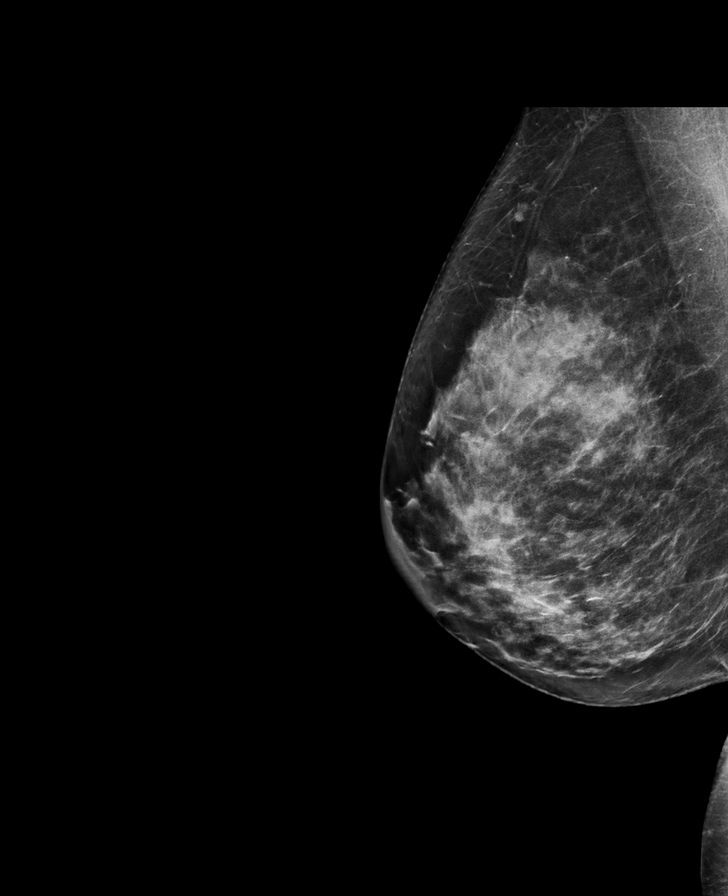

[L CC synth-2D]
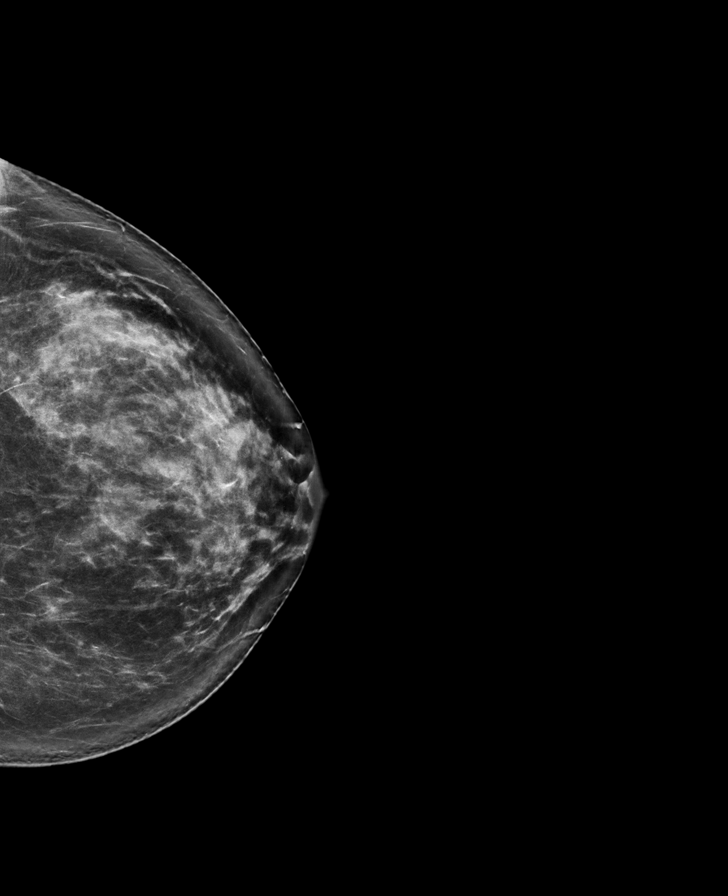

[L MLO synth-2D]
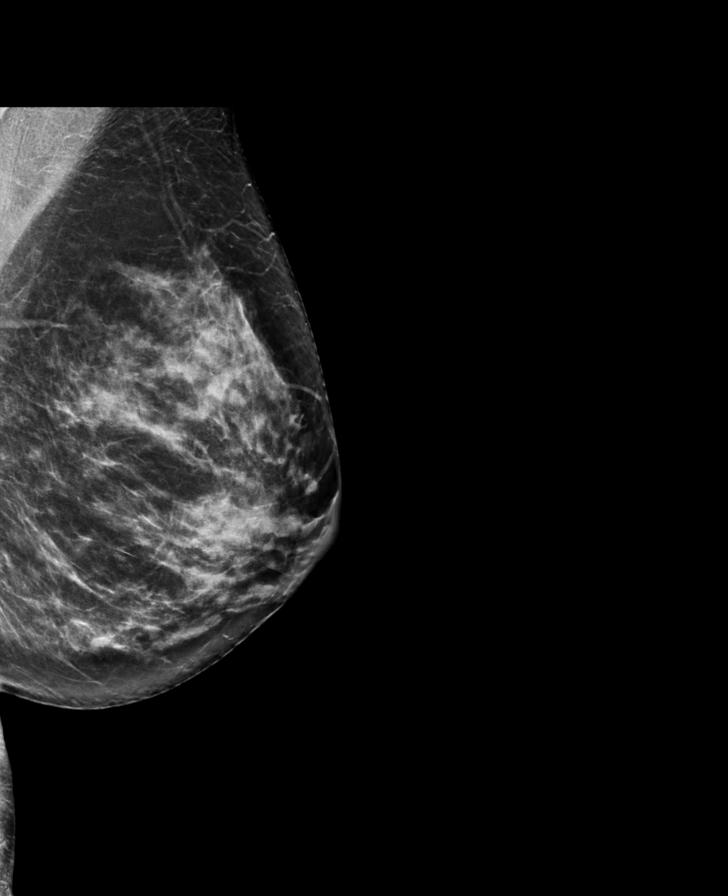

[R CC tomo · tomo slice 41/80.0]
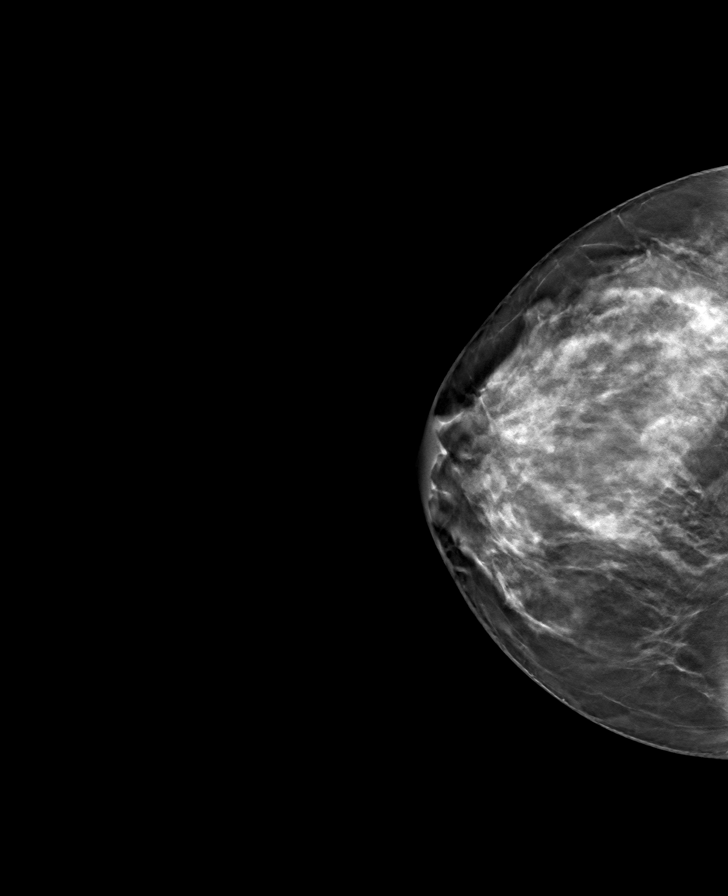

[L CC tomo · tomo slice 43/85.0]
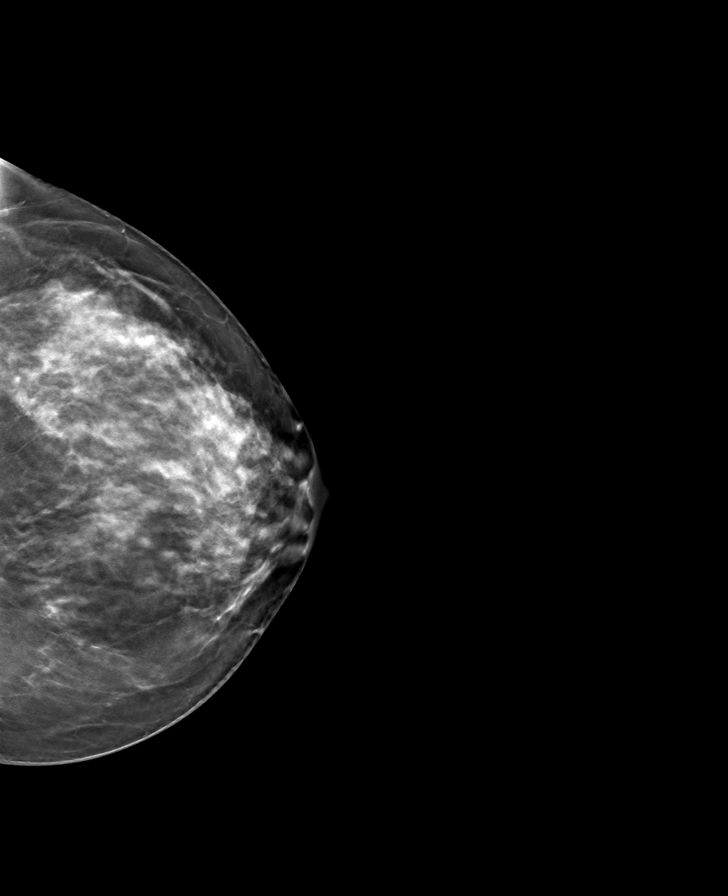

[L MLO tomo · tomo slice 42/83.0]
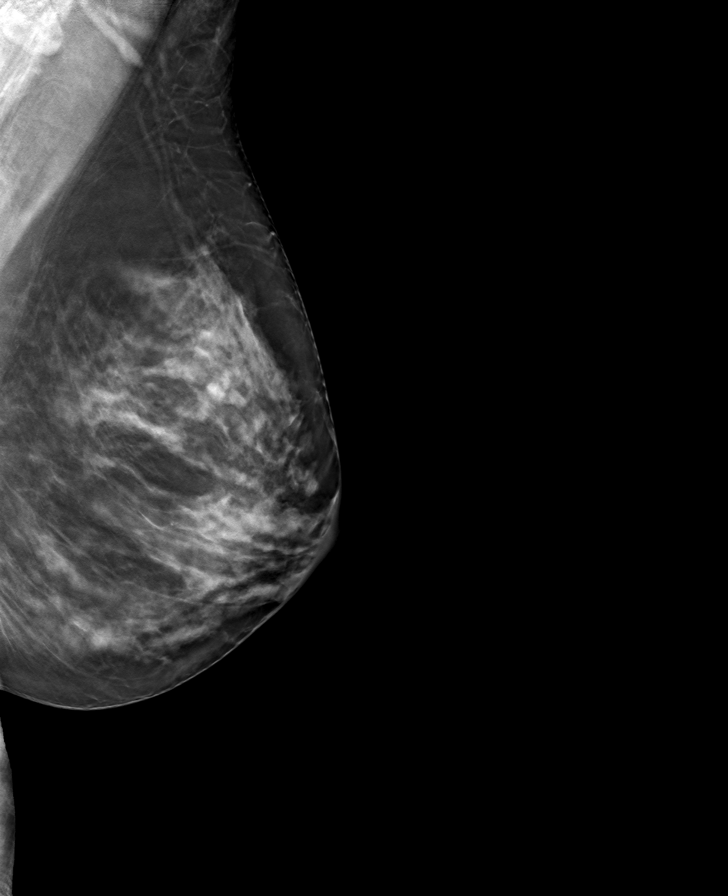

[R MLO tomo · tomo slice 41/81.0]
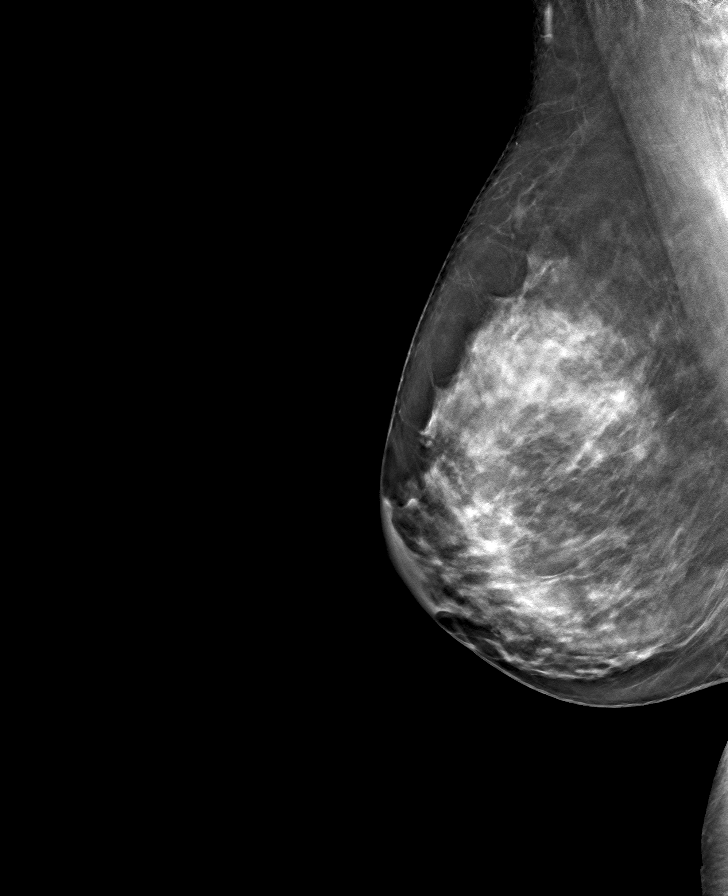

[8 of 24 positions shown; findings below may reference images not displayed]

ACR Breast Density Category c: The breast tissue is heterogeneously
dense, which may obscure small masses.
FINDINGS: There are no findings suspicious for malignancy.
IMPRESSION: No mammographic evidence of malignancy. A result letter of this
screening mammogram will be mailed directly to the patient.

RECOMMENDATION:
Screening mammogram in one year. (Code:Q3-W-BC3)

BI-RADS CATEGORY  1: Negative.

## 2023-02-24 ENCOUNTER — Other Ambulatory Visit: Payer: Self-pay | Admitting: Obstetrics and Gynecology

## 2023-02-24 DIAGNOSIS — Z1231 Encounter for screening mammogram for malignant neoplasm of breast: Secondary | ICD-10-CM

## 2023-03-26 ENCOUNTER — Ambulatory Visit
Admission: RE | Admit: 2023-03-26 | Discharge: 2023-03-26 | Disposition: A | Discharge status: Home / Self Care | Payer: BC Managed Care – PPO | Source: Ambulatory Visit

## 2023-03-26 DIAGNOSIS — Z1231 Encounter for screening mammogram for malignant neoplasm of breast: Secondary | ICD-10-CM

## 2024-03-06 ENCOUNTER — Other Ambulatory Visit: Payer: Self-pay | Admitting: Obstetrics and Gynecology

## 2024-03-06 DIAGNOSIS — Z1231 Encounter for screening mammogram for malignant neoplasm of breast: Secondary | ICD-10-CM

## 2024-04-03 ENCOUNTER — Ambulatory Visit: Admission: RE | Admit: 2024-04-03 | Discharge: 2024-04-03 | Disposition: A | Source: Ambulatory Visit

## 2024-04-03 DIAGNOSIS — Z1231 Encounter for screening mammogram for malignant neoplasm of breast: Secondary | ICD-10-CM
